# Patient Record
Sex: Female | Born: 1975 | Race: Black or African American | Hispanic: No | Marital: Single | Smoking: Never smoker
Health system: Southern US, Community
[De-identification: ages and names within clinical notes are randomized; demographics above are authoritative.]

## PROBLEM LIST (undated history)

## (undated) DIAGNOSIS — I10 Essential (primary) hypertension: Secondary | ICD-10-CM

---

## 2004-05-10 ENCOUNTER — Emergency Department: Payer: Self-pay | Admitting: Emergency Medicine

## 2004-07-26 ENCOUNTER — Emergency Department: Payer: Self-pay | Admitting: Emergency Medicine

## 2006-07-25 ENCOUNTER — Emergency Department: Payer: Self-pay | Admitting: Unknown Physician Specialty

## 2006-08-06 ENCOUNTER — Emergency Department: Payer: Self-pay | Admitting: Emergency Medicine

## 2008-01-19 ENCOUNTER — Emergency Department: Payer: Self-pay | Admitting: Emergency Medicine

## 2008-02-06 ENCOUNTER — Encounter: Payer: Self-pay | Admitting: Sports Medicine

## 2008-02-13 ENCOUNTER — Encounter: Payer: Self-pay | Admitting: Sports Medicine

## 2008-03-14 ENCOUNTER — Encounter: Payer: Self-pay | Admitting: Sports Medicine

## 2010-05-18 ENCOUNTER — Emergency Department: Payer: Self-pay | Admitting: Unknown Physician Specialty

## 2010-08-15 ENCOUNTER — Emergency Department: Payer: Self-pay | Admitting: Emergency Medicine

## 2013-04-11 ENCOUNTER — Emergency Department: Payer: Self-pay | Admitting: Internal Medicine

## 2014-10-03 ENCOUNTER — Emergency Department
Admit: 2014-10-03 | Disposition: A | Discharge status: Home / Self Care | Payer: Self-pay | Admitting: Emergency Medicine

## 2015-07-03 ENCOUNTER — Emergency Department
Admission: EM | Admit: 2015-07-03 | Discharge: 2015-07-04 | Disposition: A | Discharge status: Home / Self Care | Payer: Self-pay | Attending: Emergency Medicine | Admitting: Emergency Medicine

## 2015-07-03 DIAGNOSIS — R51 Headache: Secondary | ICD-10-CM | POA: Insufficient documentation

## 2015-07-03 DIAGNOSIS — R519 Headache, unspecified: Secondary | ICD-10-CM

## 2015-07-03 DIAGNOSIS — I10 Essential (primary) hypertension: Secondary | ICD-10-CM

## 2015-07-03 DIAGNOSIS — Z3202 Encounter for pregnancy test, result negative: Secondary | ICD-10-CM | POA: Insufficient documentation

## 2015-07-03 HISTORY — DX: Essential (primary) hypertension: I10

## 2015-07-03 LAB — CBC
HCT: 38 % (ref 35.0–47.0)
Hemoglobin: 12.3 g/dL (ref 12.0–16.0)
MCH: 27 pg (ref 26.0–34.0)
MCHC: 32.4 g/dL (ref 32.0–36.0)
MCV: 83.3 fL (ref 80.0–100.0)
Platelets: 238 10*3/uL (ref 150–440)
RBC: 4.56 MIL/uL (ref 3.80–5.20)
RDW: 13 % (ref 11.5–14.5)
WBC: 5 10*3/uL (ref 3.6–11.0)

## 2015-07-03 LAB — BASIC METABOLIC PANEL WITH GFR
Anion gap: 6 (ref 5–15)
BUN: 15 mg/dL (ref 6–20)
CO2: 28 mmol/L (ref 22–32)
Calcium: 9.1 mg/dL (ref 8.9–10.3)
Chloride: 105 mmol/L (ref 101–111)
Creatinine, Ser: 0.71 mg/dL (ref 0.44–1.00)
GFR calc Af Amer: >60 mL/min
GFR calc non Af Amer: >60 mL/min
Glucose, Bld: 94 mg/dL (ref 65–99)
Potassium: 3.5 mmol/L (ref 3.5–5.1)
Sodium: 139 mmol/L (ref 135–145)

## 2015-07-03 LAB — POCT PREGNANCY, URINE: Preg Test, Ur: NEGATIVE

## 2015-07-03 LAB — TROPONIN I: Troponin I: <0.03 ng/mL

## 2015-07-03 MED ORDER — KETOROLAC TROMETHAMINE 30 MG/ML IJ SOLN
30.0000 mg | Freq: Once | INTRAMUSCULAR | Status: AC
  Administered 2015-07-03: 30 mg via INTRAVENOUS
  Filled 2015-07-03: qty 1

## 2015-07-03 MED ORDER — SODIUM CHLORIDE 0.9 % IV BOLUS (SEPSIS)
1000.0000 mL | Freq: Once | INTRAVENOUS | Status: AC
  Administered 2015-07-03: 1000 mL via INTRAVENOUS

## 2015-07-03 MED ORDER — ONDANSETRON HCL 4 MG/2ML IJ SOLN
4.0000 mg | Freq: Once | INTRAMUSCULAR | Status: AC
  Administered 2015-07-03: 4 mg via INTRAVENOUS
  Filled 2015-07-03: qty 2

## 2015-07-03 NOTE — ED Notes (Signed)
Pt in with co headache, co chest tightness and htn 168/100.

## 2015-07-03 NOTE — ED Provider Notes (Signed)
[facility] Emergency Department Provider Note  ____________________________________________  Time seen: 11:00 PM  I have reviewed the triage vital signs and the nursing notes.   HISTORY  Chief Complaint Headache      HPI Sherry Richardson is a 40 y.o. female resents with headache and hypertension 2 days. Patient states she has a known history of hypertension but has been noncompliant with her medications for "a long time" secondary to frequent urination. Patient now presents with a frontal headache that is currently 7 out of 10 and nonradiating. Patient denies any weakness numbness no gait instability or dizziness     Past medical history Hypertension There are no active problems to display for this patient.   Past surgical history None No current outpatient prescriptions on file.  Allergies No known drug allergies No family history on file.  Social History Social History  Substance Use Topics  . Smoking status: Not on file  . Smokeless tobacco: Not on file  . Alcohol Use: Not on file    Review of Systems  Constitutional: Negative for fever. Eyes: Negative for visual changes. ENT: Negative for sore throat. Cardiovascular: Negative for chest pain. Respiratory: Negative for shortness of breath. Gastrointestinal: Negative for abdominal pain, vomiting and diarrhea. Genitourinary: Negative for dysuria. Musculoskeletal: Negative for back pain. Skin: Negative for rash. Neurological: Positive for headache   10-point ROS otherwise negative.  ____________________________________________   PHYSICAL EXAM:  VITAL SIGNS: ED Triage Vitals  Enc Vitals Group     BP 07/03/15 2030 180/97 mmHg     Pulse Rate 07/03/15 2030 84     Resp 07/03/15 2030 20     Temp 07/03/15 2030 98.2 F (36.8 C)     Temp Source 07/03/15 2030 Oral     SpO2 07/03/15 2030 100 %     Weight 07/03/15 2030 282 lb (127.914 kg)     Height 07/03/15 2030  (1.626 m)      Head Cir --      Peak Flow --      Pain Score 07/03/15 2036 7     Pain Loc --      Pain Edu? --      Excl. in GC? --     Constitutional: Alert and oriented. Well appearing and in no distress. Eyes: Conjunctivae are normal. PERRL. Normal extraocular movements. ENT   Head: Normocephalic and atraumatic.   Nose: No congestion/rhinnorhea.   Mouth/Throat: Mucous membranes are moist.   Neck: No stridor. Hematological/Lymphatic/Immunilogical: No cervical lymphadenopathy. Cardiovascular: Normal rate, regular rhythm. Normal and symmetric distal pulses are present in all extremities. No murmurs, rubs, or gallops. Respiratory: Normal respiratory effort without tachypnea nor retractions. Breath sounds are clear and equal bilaterally. No wheezes/rales/rhonchi. Gastrointestinal: Soft and nontender. No distention. There is no CVA tenderness. Genitourinary: deferred Musculoskeletal: Nontender with normal range of motion in all extremities. No joint effusions.  No lower extremity tenderness nor edema. Neurologic:  Normal speech and language. No gross focal neurologic deficits are appreciated. Speech is normal.  Skin:  Skin is warm, dry and intact. No rash noted. Psychiatric: Mood and affect are normal. Speech and behavior are normal. Patient exhibits appropriate insight and judgment.    EKG  ED ECG REPORT I, BROWN, [place] N, the attending physician, personally viewed and interpreted this ECG.   Date: 07/03/2015  EKG Time: 8:41 PM  Rate: 67  Rhythm: Normal sinus rhythm  Axis: None  Intervals: Normal  ST&T Change: None   ____________________________________________  RADIOLOGY    CT Angio Head W/Cm &/Or Wo Cm (Final result) Result time: 07/04/15 00:49:12   Final result by Rad Results In Interface (07/04/15 00:49:12)   Narrative:   CLINICAL DATA: Severe headache for 2 days, chest tightness and hypertension.  EXAM: CT ANGIOGRAPHY HEAD  TECHNIQUE: Multidetector  CT imaging of the head was performed using the standard protocol during bolus administration of intravenous contrast. Multiplanar CT image reconstructions and MIPs were obtained to evaluate the vascular anatomy.  CONTRAST: OMNIPAQUE IOHEXOL 350 MG/ML SOLN  COMPARISON: None.  FINDINGS: CT HEAD  The ventricles and sulci are normal. No intraparenchymal hemorrhage, mass effect nor midline shift. No acute large vascular territory infarcts. No abnormal intracranial enhancement.  No abnormal extra-axial fluid collections. Basal cisterns are patent.  No skull fracture. 10 mm bony excrescence from RIGHT frontal calvarium vertex, could represent a meningioma without mass effect. The included ocular globes and orbital contents are non-suspicious. The mastoid aircells and included paranasal sinuses are well-aerated. Fullness of the nasopharyngeal soft tissues can be seen with recent viral illness or, immunocompromised states.  CTA HEAD  Anterior circulation: Normal appearance of the cervical internal carotid arteries, petrous, cavernous and supra clinoid internal carotid arteries. Widely patent anterior communicating artery. Normal appearance of the anterior and middle cerebral arteries.  Posterior circulation: Codominant vertebral arteries with normal appearance of the vertebral arteries, vertebrobasilar junction and basilar artery, as well as main branch vessels. Normal appearance of the posterior cerebral arteries.  No large vessel occlusion, hemodynamically significant stenosis, dissection, luminal irregularity, contrast extravasation or aneurysm within the anterior nor posterior circulation.  IMPRESSION: 10 mm possible RIGHT frontal meningioma with otherwise negative CT head with and without contrast.  Normal CTA head.   Electronically Signed By: Awilda Metro M.D. On: 07/04/2015 00:49          INITIAL IMPRESSION / ASSESSMENT AND PLAN / ED  COURSE  Pertinent labs & imaging results that were available during my care of the patient were reviewed by me and considered in my medical decision making (see chart for details).    ____________________________________________   FINAL CLINICAL IMPRESSION(S) / ED DIAGNOSES  Final diagnoses:  Hypertension  Headache      Darci Current, MD 07/04/15 [phone]

## 2015-07-04 ENCOUNTER — Encounter: Payer: Self-pay | Admitting: Radiology

## 2015-07-04 ENCOUNTER — Emergency Department: Payer: Medicaid Other

## 2015-07-04 MED ORDER — PROCHLORPERAZINE EDISYLATE 5 MG/ML IJ SOLN
INTRAMUSCULAR | Status: AC
  Administered 2015-07-04: 10 mg via INTRAVENOUS
  Filled 2015-07-04: qty 2

## 2015-07-04 MED ORDER — LISINOPRIL 20 MG PO TABS: 20.0000 mg | ORAL_TABLET | Freq: Every day | ORAL | Status: DC

## 2015-07-04 MED ORDER — IOHEXOL 350 MG/ML SOLN
100.0000 mL | Freq: Once | INTRAVENOUS | Status: AC | PRN
Start: 2015-07-04 — End: 2015-07-04
  Administered 2015-07-04: 100 mL via INTRAVENOUS

## 2015-07-04 MED ORDER — PROCHLORPERAZINE EDISYLATE 5 MG/ML IJ SOLN
10.0000 mg | Freq: Once | INTRAMUSCULAR | Status: AC
  Administered 2015-07-04: 10 mg via INTRAVENOUS

## 2015-07-04 NOTE — Discharge Instructions (Signed)
General Headache Without Cause °A headache is pain or discomfort felt around the head or neck area. There are many causes and types of headaches. In some cases, the cause may not be found.  °HOME CARE  °Managing Pain °· Take over-the-counter and prescription medicines only as told by your doctor. °· Lie down in a dark, quiet room when you have a headache. °· If directed, apply ice to the head and neck area: °· Put ice in a plastic bag. °· Place a towel between your skin and the bag. °· Leave the ice on for 20 minutes, 2-3 times per day. °· Use a heating pad or hot shower to apply heat to the head and neck area as told by your doctor. °· Keep lights dim if bright lights bother you or make your headaches worse. °Eating and Drinking °· Eat meals on a regular schedule. °· Lessen how much alcohol you drink. °· Lessen how much caffeine you drink, or stop drinking caffeine. °General Instructions °· Keep all follow-up visits as told by your doctor. This is important. °· Keep a journal to find out if certain things bring on headaches. For example, write down: °· What you eat and drink. °· How much sleep you get. °· Any change to your diet or medicines. °· Relax by getting a massage or doing other relaxing activities. °· Lessen stress. °· Sit up straight. Do not tighten (tense) your muscles. °· Do not use tobacco products. This includes cigarettes, chewing tobacco, or e-cigarettes. If you need help quitting, ask your doctor. °· Exercise regularly as told by your doctor. °· Get enough sleep. This often means 7-9 hours of sleep. °GET HELP IF: °· Your symptoms are not helped by medicine. °· You have a headache that feels different than the other headaches. °· You feel sick to your stomach (nauseous) or you throw up (vomit). °· You have a fever. °GET HELP RIGHT AWAY IF:  °· Your headache becomes really bad. °· You keep throwing up. °· You have a stiff neck. °· You have trouble seeing. °· You have trouble speaking. °· You have  pain in the eye or ear. °· Your muscles are weak or you lose muscle control. °· You lose your balance or have trouble walking. °· You feel like you will pass out (faint) or you pass out. °· You have confusion. °  °This information is not intended to replace advice given to you by your health care provider. Make sure you discuss any questions you have with your health care provider. °  °Document Released: 03/09/2008 Document Revised: 02/19/2015 Document Reviewed: 09/23/2014 °Elsevier Interactive Patient Education ©2016 [employer] ° °Hypertension °Hypertension, commonly called high blood pressure, is when the force of blood pumping through your arteries is too strong. Your arteries are the blood vessels that carry blood from your heart throughout your body. A blood pressure reading consists of a higher number over a lower number, such as 110/72. The higher number (systolic) is the pressure inside your arteries when your heart pumps. The lower number (diastolic) is the pressure inside your arteries when your heart relaxes. Ideally you want your blood pressure below 120/80. °Hypertension forces your heart to work harder to pump blood. Your arteries may become narrow or stiff. Having untreated or uncontrolled hypertension can cause heart attack, stroke, kidney disease, and other problems. °RISK FACTORS °Some risk factors for high blood pressure are controllable. Others are not.  °Risk factors you cannot control include:  °· Race. You may   be at higher risk if you are African American. °· Age. Risk increases with age. °· Gender. Men are at higher risk than women before age 45 years. After age 65, women are at higher risk than men. °Risk factors you can control include: °· Not getting enough exercise or physical activity. °· Being overweight. °· Getting too much fat, sugar, calories, or salt in your diet. °· Drinking too much alcohol. °SIGNS AND SYMPTOMS °Hypertension does not usually cause signs or symptoms. Extremely  high blood pressure (hypertensive crisis) may cause headache, anxiety, shortness of breath, and nosebleed. °DIAGNOSIS °To check if you have hypertension, your health care provider will measure your blood pressure while you are seated, with your arm held at the level of your heart. It should be measured at least twice using the same arm. Certain conditions can cause a difference in blood pressure between your right and left arms. A blood pressure reading that is higher than normal on one occasion does not mean that you need treatment. If it is not clear whether you have high blood pressure, you may be asked to return on a different day to have your blood pressure checked again. Or, you may be asked to monitor your blood pressure at home for 1 or more weeks. °TREATMENT °Treating high blood pressure includes making lifestyle changes and possibly taking medicine. Living a healthy lifestyle can help lower high blood pressure. You may need to change some of your habits. °Lifestyle changes may include: °· Following the DASH diet. This diet is high in fruits, vegetables, and whole grains. It is low in salt, red meat, and added sugars. °· Keep your sodium intake below 2,300 mg per day. °· Getting at least 30-45 minutes of aerobic exercise at least 4 times per week. °· Losing weight if necessary. °· Not smoking. °· Limiting alcoholic beverages. °· Learning ways to reduce stress. °Your health care provider may prescribe medicine if lifestyle changes are not enough to get your blood pressure under control, and if one of the following is true: °· You are 18-59 years of age and your systolic blood pressure is above 140. °· You are 60 years of age or older, and your systolic blood pressure is above 150. °· Your diastolic blood pressure is above 90. °· You have diabetes, and your systolic blood pressure is over 140 or your diastolic blood pressure is over 90. °· You have kidney disease and your blood pressure is above  140/90. °· You have heart disease and your blood pressure is above 140/90. °Your personal target blood pressure may vary depending on your medical conditions, your age, and other factors. °HOME CARE INSTRUCTIONS °· Have your blood pressure rechecked as directed by your health care provider.   °· Take medicines only as directed by your health care provider. Follow the directions carefully. Blood pressure medicines must be taken as prescribed. The medicine does not work as well when you skip doses. Skipping doses also puts you at risk for problems. °· Do not smoke.   °· Monitor your blood pressure at home as directed by your health care provider.  °SEEK MEDICAL CARE IF:  °· You think you are having a reaction to medicines taken. °· You have recurrent headaches or feel dizzy. °· You have swelling in your ankles. °· You have trouble with your vision. °SEEK IMMEDIATE MEDICAL CARE IF: °· You develop a severe headache or confusion. °· You have unusual weakness, numbness, or feel faint. °· You have severe chest or abdominal pain. °·   You vomit repeatedly. °· You have trouble breathing. °MAKE SURE YOU:  °· Understand these instructions. °· Will watch your condition. °· Will get help right away if you are not doing well or get worse. °  °This information is not intended to replace advice given to you by your health care provider. Make sure you discuss any questions you have with your health care provider. °  °Document Released: 05/31/2005 Document Revised: 10/15/2014 Document Reviewed: 03/23/2013 °Elsevier Interactive Patient Education ©2016 Elsevier Inc. ° °

## 2016-03-18 ENCOUNTER — Encounter: Payer: Self-pay | Admitting: Emergency Medicine

## 2016-03-18 ENCOUNTER — Emergency Department
Admission: EM | Admit: 2016-03-18 | Discharge: 2016-03-18 | Disposition: A | Discharge status: Home / Self Care | Payer: Medicaid Other | Attending: Emergency Medicine | Admitting: Emergency Medicine

## 2016-03-18 DIAGNOSIS — H1031 Unspecified acute conjunctivitis, right eye: Secondary | ICD-10-CM

## 2016-03-18 DIAGNOSIS — I10 Essential (primary) hypertension: Secondary | ICD-10-CM | POA: Insufficient documentation

## 2016-03-18 DIAGNOSIS — Z79899 Other long term (current) drug therapy: Secondary | ICD-10-CM | POA: Insufficient documentation

## 2016-03-18 MED ORDER — CIPROFLOXACIN HCL 0.3 % OP SOLN
2.0000 [drp] | OPHTHALMIC | Status: DC
  Administered 2016-03-18: 2 [drp] via OPHTHALMIC
  Filled 2016-03-18: qty 2.5

## 2016-03-18 MED ORDER — CIPROFLOXACIN HCL 0.3 % OP SOLN: 1.0000 [drp] | OPHTHALMIC | 0 refills | Status: AC

## 2016-03-18 MED ORDER — FLUORESCEIN SODIUM 1 MG OP STRP
1.0000 | ORAL_STRIP | Freq: Once | OPHTHALMIC | Status: AC
  Administered 2016-03-18: 1 via OPHTHALMIC

## 2016-03-18 MED ORDER — TETRACAINE HCL 0.5 % OP SOLN
2.0000 [drp] | Freq: Once | OPHTHALMIC | Status: AC
  Administered 2016-03-18: 2 [drp] via OPHTHALMIC

## 2016-03-18 MED ORDER — FLUORESCEIN SODIUM 1 MG OP STRP
ORAL_STRIP | OPHTHALMIC | Status: AC
  Administered 2016-03-18: 1 via OPHTHALMIC
  Filled 2016-03-18: qty 1

## 2016-03-18 MED ORDER — TETRACAINE HCL 0.5 % OP SOLN
OPHTHALMIC | Status: AC
Start: 2016-03-18 — End: 2016-03-18
  Administered 2016-03-18: 2 [drp] via OPHTHALMIC
  Filled 2016-03-18: qty 2

## 2016-03-18 NOTE — ED Triage Notes (Signed)
Pt arrived to the ED for complaints of right eye pain. Pt states that she woke up with the swelling denies any injury. Pt's right eye is swollen and has a mild redness. Pt is AOx4 in no apparent distress.

## 2016-03-18 NOTE — ED Provider Notes (Signed)
[facility] Emergency Department Provider Note ____________________________________________  Time seen: Approximately 9:27 PM  I have reviewed the triage vital signs and the nursing notes.   HISTORY  Chief Complaint Eye Pain   HPI Sherry Richardson is a 40 y.o. female who presents to the emergency department for evaluation of right eye pain. She awakened with swelling to the right upper lid with some drainage and itching with some purulent drainage. She has had no known exposure to conjunctivitis but denies injury. She does not wear contact lenses. She is hypertensive today, but endorses a history of hypertension and is not taking any medication. She is asymptomatic with the exception of right eye pain.  Past Medical History:  Diagnosis Date  . Hypertension     There are no active problems to display for this patient.   History reviewed. No pertinent surgical history.  Prior to Admission medications   Medication Sig Start Date End Date Taking? Authorizing Provider  ciprofloxacin (CILOXAN) 0.3 % ophthalmic solution Place 1 drop into the right eye every 2 (two) hours. Administer 1 drop, every 2 hours, while awake, for 2 days. Then 1 drop, every 4 hours, while awake, for the next 5 days. 03/18/16 03/23/16  Chinita Pester, FNP  lisinopril (PRINIVIL,ZESTRIL) 20 MG tablet Take 1 tablet (20 mg total) by mouth daily. 07/04/15 07/03/16  Darci Current, MD    Allergies Review of patient's allergies indicates no known allergies.  History reviewed. No pertinent family history.  Social History Social History  Substance Use Topics  . Smoking status: Never Smoker  . Smokeless tobacco: Never Used  . Alcohol use Yes    Review of Systems   Constitutional: No fever/chills Eyes: Negative for visual changes. Positive for pain. Musculoskeletal: Negative for pain. Skin: Negative for rash. Neurological: Negative for headaches, focal weakness or numbness. Allergic:  Negative for seasonal allergies. ____________________________________________  PHYSICAL EXAM:  VITAL SIGNS: ED Triage Vitals  Enc Vitals Group     BP 03/18/16 2040 (!) 166/101     Pulse Rate 03/18/16 2040 80     Resp 03/18/16 2040 18     Temp 03/18/16 2040 98.5 F (36.9 C)     Temp Source 03/18/16 2040 Oral     SpO2 03/18/16 2040 97 %     Weight 03/18/16 2042 288 lb (130.6 kg)     Height 03/18/16 2042 5\' 6"  (1.676 m)     Head Circumference --      Peak Flow --      Pain Score 03/18/16 2042 8     Pain Loc --      Pain Edu? --      Excl. in GC? --     Constitutional: Alert and oriented. Well appearing and in no acute distress. Eyes: Visual acuity--see nursing documentation; no globe trauma; right upper eyelid mildly edematous; Sclera appears anicteric.  Eyelids were inverted. Conjunctiva appears erythematous with purulent exudate in the inner canthus; Cornea--no abrasion on stain exam. Head: Atraumatic. Nose: No congestion/rhinnorhea. Mouth/Throat: Mucous membranes are moist. Respiratory: Rate even and unalbored. Musculoskeletal:Normal ROM x 4 extremities. Neurologic:  Normal speech and language. No gross focal neurologic deficits are appreciated. Speech is normal. No gait instability. Skin:  Skin is warm, dry and intact. No rash noted. Psychiatric: Mood and affect are normal. Speech and behavior are normal.  ____________________________________________   LABS (all labs ordered are listed, but only abnormal results are displayed)  Labs Reviewed - No data to display ____________________________________________  EKG   ____________________________________________  RADIOLOGY  Not inidcated. ____________________________________________   PROCEDURES  Procedure(s) performed:   Fluorescein stain exam to the right eye. IOP exam via Tono-Pen: 17, 18, 17 ____________________________________________   INITIAL IMPRESSION / ASSESSMENT AND PLAN / ED COURSE  Pertinent  labs & imaging results that were available during my care of the patient were reviewed by me and considered in my medical decision making (see chart for details).  Clinical Course    Patient to receive prescriptions for Ciprofloxacin drops.  She was advised to follow up with opthalmology for symptoms that are not improving over the next 2-3 days.She was also advised to see her primary care provider for management of her hypertension. She was advised to return to the ER for symptoms that change or worsen if unable to schedule an appointment.  ____________________________________________   FINAL CLINICAL IMPRESSION(S) / ED DIAGNOSES  Final diagnoses:  Hypertension, unspecified type  Acute conjunctivitis of right eye, unspecified acute conjunctivitis type    Note:  This document was prepared using Dragon voice recognition software and may include unintentional dictation errors.    Chinita PesterCari B Per Beagley, FNP 03/20/16 1021    Nita Sicklearolina Veronese, MD 03/21/16 2224

## 2016-10-20 ENCOUNTER — Emergency Department: Payer: Medicaid Other

## 2016-10-20 ENCOUNTER — Encounter: Payer: Self-pay | Admitting: Emergency Medicine

## 2016-10-20 ENCOUNTER — Emergency Department
Admission: EM | Admit: 2016-10-20 | Discharge: 2016-10-20 | Disposition: A | Discharge status: Home / Self Care | Payer: Medicaid Other | Attending: Emergency Medicine | Admitting: Emergency Medicine

## 2016-10-20 DIAGNOSIS — I10 Essential (primary) hypertension: Secondary | ICD-10-CM | POA: Diagnosis not present

## 2016-10-20 DIAGNOSIS — R42 Dizziness and giddiness: Secondary | ICD-10-CM | POA: Diagnosis not present

## 2016-10-20 LAB — BASIC METABOLIC PANEL
ANION GAP: 7 (ref 5–15)
BUN: 11 mg/dL (ref 6–20)
CALCIUM: 9.1 mg/dL (ref 8.9–10.3)
CO2: 28 mmol/L (ref 22–32)
Chloride: 103 mmol/L (ref 101–111)
Creatinine, Ser: 0.73 mg/dL (ref 0.44–1.00)
Glucose, Bld: 107 mg/dL — ABNORMAL HIGH (ref 65–99)
Potassium: 3.1 mmol/L — ABNORMAL LOW (ref 3.5–5.1)
Sodium: 138 mmol/L (ref 135–145)

## 2016-10-20 LAB — CBC
HCT: 37.9 % (ref 35.0–47.0)
HEMOGLOBIN: 12.6 g/dL (ref 12.0–16.0)
MCH: 28 pg (ref 26.0–34.0)
MCHC: 33.3 g/dL (ref 32.0–36.0)
MCV: 84.2 fL (ref 80.0–100.0)
Platelets: 251 10*3/uL (ref 150–440)
RBC: 4.5 MIL/uL (ref 3.80–5.20)
RDW: 13.9 % (ref 11.5–14.5)
WBC: 6.2 10*3/uL (ref 3.6–11.0)

## 2016-10-20 LAB — TROPONIN I

## 2016-10-20 MED ORDER — SODIUM CHLORIDE 0.9 % IV SOLN
Freq: Once | INTRAVENOUS | Status: AC
  Administered 2016-10-20: 12:00:00 via INTRAVENOUS

## 2016-10-20 MED ORDER — LISINOPRIL 10 MG PO TABS
20.0000 mg | ORAL_TABLET | Freq: Once | ORAL | Status: AC
  Administered 2016-10-20: 20 mg via ORAL
  Filled 2016-10-20: qty 2

## 2016-10-20 MED ORDER — METOCLOPRAMIDE HCL 5 MG/ML IJ SOLN
10.0000 mg | Freq: Once | INTRAMUSCULAR | Status: AC
  Administered 2016-10-20: 10 mg via INTRAVENOUS
  Filled 2016-10-20: qty 2

## 2016-10-20 MED ORDER — KETOROLAC TROMETHAMINE 30 MG/ML IJ SOLN
30.0000 mg | Freq: Once | INTRAMUSCULAR | Status: AC
  Administered 2016-10-20: 30 mg via INTRAVENOUS
  Filled 2016-10-20: qty 1

## 2016-10-20 MED ORDER — ONDANSETRON HCL 4 MG/2ML IJ SOLN
4.0000 mg | Freq: Once | INTRAMUSCULAR | Status: AC
  Administered 2016-10-20: 4 mg via INTRAVENOUS
  Filled 2016-10-20: qty 2

## 2016-10-20 MED ORDER — DIAZEPAM 5 MG PO TABS
5.0000 mg | ORAL_TABLET | Freq: Once | ORAL | Status: AC
  Administered 2016-10-20: 5 mg via ORAL
  Filled 2016-10-20: qty 1

## 2016-10-20 MED ORDER — LISINOPRIL 20 MG PO TABS: 20.0000 mg | ORAL_TABLET | Freq: Every day | ORAL | 0 refills | Status: AC

## 2016-10-20 NOTE — ED Triage Notes (Signed)
Pt presents to ED via [facility]. Per EMS pt was picked up from work with c/o midline chest pressure 7/10 with no radiation. EMS states patient also c/o dizziness, both chest pain and dizziness started at approz 0400. Pt denies any N/V/SHOB, denies any numbness/tingling/vision changes. Pt is alert and oriented and ambulatory on arrival.

## 2016-10-20 NOTE — ED Provider Notes (Addendum)
[facility] Emergency Department Provider Note       Time seen: ----------------------------------------- 11:24 AM on 10/20/2016 -----------------------------------------     I have reviewed the triage vital signs and the nursing notes.   HISTORY   Chief Complaint No chief complaint on file.    HPI Sherry Richardson is a 41 y.o. female who presents to the ED for dizziness chest pain. Patient states she was at work when she felt like she may pass out. She reports she has not had her blood pressure medicine and some time. She denies any recent illness, does not take any over-the-counter medications. Symptom onset was this morning.   Past Medical History:  Diagnosis Date  . Hypertension     There are no active problems to display for this patient.   No past surgical history on file.  Allergies Patient has no known allergies.  Social History Social History  Substance Use Topics  . Smoking status: Never Smoker  . Smokeless tobacco: Never Used  . Alcohol use Yes    Review of Systems Constitutional: Negative for fever. Eyes: Negative for vision changes ENT:  Negative for congestion, sore throat Cardiovascular: Positive for chest pain Respiratory: Negative for shortness of breath. Gastrointestinal: Negative for abdominal pain, vomiting and diarrhea. Genitourinary: Negative for dysuria. Musculoskeletal: Negative for back pain. Skin: Negative for rash. Neurological: Negative for headaches, focal weakness or numbness. Positive for dizziness  All systems negative/normal/unremarkable except as stated in the HPI  ____________________________________________   PHYSICAL EXAM:  VITAL SIGNS: ED Triage Vitals  Enc Vitals Group     BP      Pulse      Resp      Temp      Temp src      SpO2      Weight      Height      Head Circumference      Peak Flow      Pain Score      Pain Loc      Pain Edu?      Excl. in GC?     Constitutional:  Alert and oriented. Anxious, no distress Eyes: Conjunctivae are normal. PERRL. Normal extraocular movements. ENT   Head: Normocephalic and atraumatic.   Nose: No congestion/rhinnorhea.   Mouth/Throat: Mucous membranes are moist.   Neck: No stridor. Cardiovascular: Normal rate, regular rhythm. No murmurs, rubs, or gallops. Respiratory: Normal respiratory effort without tachypnea nor retractions. Breath sounds are clear and equal bilaterally. No wheezes/rales/rhonchi. Gastrointestinal: Soft and nontender. Normal bowel sounds Musculoskeletal: Nontender with normal range of motion in extremities. No lower extremity tenderness nor edema. Neurologic:  Normal speech and language. No gross focal neurologic deficits are appreciated.  Skin:  Skin is warm, dry and intact. No rash noted. Psychiatric: Mood and affect are normal. Speech and behavior are normal.  ____________________________________________  EKG: Interpreted by me. Sinus rhythm rate 95 bpm, normal PR interval, normal QRS, long QT.  ____________________________________________  ED COURSE:  Pertinent labs & imaging results that were available during my care of the patient were reviewed by me and considered in my medical decision making (see chart for details). Patient presents for dizziness and chest pain, we will assess with labs and imaging as indicated.   Procedures ____________________________________________   LABS (pertinent positives/negatives)  Labs Reviewed  BASIC METABOLIC PANEL - Abnormal; Notable for the following:       Result Value   Potassium 3.1 (*)    Glucose, Bld  107 (*)    All other components within normal limits  CBC  TROPONIN I    RADIOLOGY  Chest x-ray IMPRESSION: No acute intracranial abnormalities. CT head  Dural calcification at RIGHT vertex, 10 mm diameter question meningioma unchanged. IMPRESSION: No active disease. ____________________________________________  FINAL  ASSESSMENT AND PLAN  Chest pain, hypertension, headache, dizziness  Plan: Patient's labs and imaging were dictated above. Patient had presented for multiple complaints including chest pain, high blood pressure, dizziness and headache. Patient is concerned she may have migraines. She describes typically right-sided headaches that she takes Excedrin Migraine for. She appears neurologically intact, labs and workup here has been reassuring. I have restarted her high blood pressure medication and we'll prescribe headache medicine for her to take at home. She is stable for outpatient follow-up with her doctor.   Emily FilbertWilliams, Achilles Neville E, MD   Note: This note was generated in part or whole with voice recognition software. Voice recognition is usually quite accurate but there are transcription errors that can and very often do occur. I apologize for any typographical errors that were not detected and corrected.     Emily FilbertWilliams, Caine Barfield E, MD 10/20/16 1414    Emily FilbertWilliams, Gordana Kewley E, MD 10/20/16 [phone]

## 2016-10-20 NOTE — ED Notes (Signed)
Explained and apologized for delay. Pt states understanding. Pt noted to be resting in bed with eyes closed prior to this RN waking her up. Will continue to monitor for further patient needs

## 2016-10-20 NOTE — ED Notes (Signed)
NAD noted at time of D/C. Pt taken to lobby via wheelchair by her family. Denies any comments/concerns at this time.

## 2018-02-27 ENCOUNTER — Emergency Department
Admission: EM | Admit: 2018-02-27 | Discharge: 2018-02-27 | Disposition: A | Discharge status: Home / Self Care | Payer: Medicaid Other | Attending: Emergency Medicine | Admitting: Emergency Medicine

## 2018-02-27 ENCOUNTER — Emergency Department: Payer: Medicaid Other

## 2018-02-27 ENCOUNTER — Encounter: Payer: Self-pay | Admitting: Emergency Medicine

## 2018-02-27 ENCOUNTER — Other Ambulatory Visit: Payer: Self-pay

## 2018-02-27 DIAGNOSIS — R519 Headache, unspecified: Secondary | ICD-10-CM

## 2018-02-27 DIAGNOSIS — I10 Essential (primary) hypertension: Secondary | ICD-10-CM | POA: Insufficient documentation

## 2018-02-27 DIAGNOSIS — R51 Headache: Secondary | ICD-10-CM | POA: Insufficient documentation

## 2018-02-27 LAB — COMPREHENSIVE METABOLIC PANEL
ALT: 12 U/L (ref 0–44)
AST: 14 U/L — AB (ref 15–41)
Albumin: 3.7 g/dL (ref 3.5–5.0)
Alkaline Phosphatase: 74 U/L (ref 38–126)
Anion gap: 6 (ref 5–15)
BUN: 12 mg/dL (ref 6–20)
CHLORIDE: 105 mmol/L (ref 98–111)
CO2: 28 mmol/L (ref 22–32)
CREATININE: 0.57 mg/dL (ref 0.44–1.00)
Calcium: 9 mg/dL (ref 8.9–10.3)
GFR calc Af Amer: >60 mL/min (ref >60)
GFR calc non Af Amer: >60 mL/min (ref >60)
Glucose, Bld: 110 mg/dL — ABNORMAL HIGH (ref 70–99)
Potassium: 3.6 mmol/L (ref 3.5–5.1)
SODIUM: 139 mmol/L (ref 135–145)
Total Bilirubin: 0.7 mg/dL (ref 0.3–1.2)
Total Protein: 7.8 g/dL (ref 6.5–8.1)

## 2018-02-27 LAB — CBC
HCT: 36.9 % (ref 35.0–47.0)
HEMOGLOBIN: 12.3 g/dL (ref 12.0–16.0)
MCH: 28.7 pg (ref 26.0–34.0)
MCHC: 33.4 g/dL (ref 32.0–36.0)
MCV: 85.8 fL (ref 80.0–100.0)
PLATELETS: 229 10*3/uL (ref 150–440)
RBC: 4.3 MIL/uL (ref 3.80–5.20)
RDW: 14 % (ref 11.5–14.5)
WBC: 5.5 10*3/uL (ref 3.6–11.0)

## 2018-02-27 LAB — TROPONIN I

## 2018-02-27 MED ORDER — SODIUM CHLORIDE 0.9 % IV BOLUS
1000.0000 mL | Freq: Once | INTRAVENOUS | Status: AC
  Administered 2018-02-27: 1000 mL via INTRAVENOUS

## 2018-02-27 MED ORDER — AMLODIPINE BESYLATE 5 MG PO TABS: 5.0000 mg | ORAL_TABLET | Freq: Every day | ORAL | 1 refills | Status: DC

## 2018-02-27 MED ORDER — PROCHLORPERAZINE EDISYLATE 10 MG/2ML IJ SOLN
10.0000 mg | Freq: Once | INTRAMUSCULAR | Status: AC
  Administered 2018-02-27: 10 mg via INTRAVENOUS
  Filled 2018-02-27: qty 2

## 2018-02-27 NOTE — ED Provider Notes (Signed)
[facility] Emergency Department Provider Note  ____________________________________________   I have reviewed the triage vital signs and the nursing notes.   HISTORY  Chief Complaint Headache   History limited by: Not Limited   HPI Sherry Richardson is a 42 y.o. female who presents to the emergency department today because of concerns for headache.  Patient states is been going on for 2 days.  Located primarily in the front of her head and behind her right eye.  This is typically where she gets her headaches.  She is tried Tylenol and ibuprofen at home without any relief.  She did eyes any significant nausea or vomiting.  She has not noticed any photophobia.  She did have some complaint of some pressure on the right side of her chest as well.   Per medical record review patient has a history of hypertension  Past Medical History:  Diagnosis Date  . Hypertension     There are no active problems to display for this patient.   History reviewed. No pertinent surgical history.  Prior to Admission medications   Medication Sig Start Date End Date Taking? Authorizing Provider  lisinopril (PRINIVIL,ZESTRIL) 20 MG tablet Take 1 tablet (20 mg total) by mouth daily. 10/20/16 10/20/17  Emily FilbertWilliams, Jonathan E, MD    Allergies Patient has no known allergies.  No family history on file.  Social History Social History   Tobacco Use  . Smoking status: Never Smoker  . Smokeless tobacco: Never Used  Substance Use Topics  . Alcohol use: Yes  . Drug use: No    Review of Systems Constitutional: No fever/chills Eyes: No visual changes. ENT: No sore throat. Cardiovascular: Positive for right-sided chest pain Respiratory: Denies shortness of breath. Gastrointestinal: No abdominal pain.  No nausea, no vomiting.  No diarrhea.   Genitourinary: Negative for dysuria. Musculoskeletal: Negative for back pain. Skin: Negative for rash. Neurological: Positive for  headache  ____________________________________________   PHYSICAL EXAM:  VITAL SIGNS: ED Triage Vitals  Enc Vitals Group     BP 02/27/18 1242 (!) 162/100     Pulse Rate 02/27/18 1242 84     Resp 02/27/18 1242 18     Temp 02/27/18 1242 98.6 F (37 C)     Temp Source 02/27/18 1242 Oral     SpO2 02/27/18 1242 96 %     Weight 02/27/18 1244 280 lb (127 kg)     Height 02/27/18 1244 5\' 4"  (1.626 m)     Head Circumference --      Peak Flow --      Pain Score 02/27/18 1244 9   Constitutional: Alert and oriented.  Eyes: Conjunctivae are normal.  ENT      Head: Normocephalic and atraumatic.      Nose: No congestion/rhinnorhea.      Mouth/Throat: Mucous membranes are moist.      Neck: No stridor. Hematological/Lymphatic/Immunilogical: No cervical lymphadenopathy. Cardiovascular: Normal rate, regular rhythm.  No murmurs, rubs, or gallops.  Respiratory: Normal respiratory effort without tachypnea nor retractions. Breath sounds are clear and equal bilaterally. No wheezes/rales/rhonchi. Gastrointestinal: Soft and non tender. No rebound. No guarding.  Genitourinary: Deferred Musculoskeletal: Normal range of motion in all extremities. No lower extremity edema. Neurologic:  Normal speech and language. No gross focal neurologic deficits are appreciated.  Skin:  Skin is warm, dry and intact. No rash noted. Psychiatric: Mood and affect are normal. Speech and behavior are normal. Patient exhibits appropriate insight and judgment.  ____________________________________________  LABS (pertinent positives/negatives)  Trop <0.03 CBC wbc 5.5, hgb 12.3, plt 229 CMP wnl except glu 110, ast 14  ____________________________________________   EKG  I, Phineas Semen, attending physician, personally viewed and interpreted this EKG  EKG Time: 1310 Rate: 74 Rhythm: normal sinus rhythm Axis: normal Intervals: qtc 432 QRS: narrow ST changes: no st elevation Impression: normal  ekg  ____________________________________________    RADIOLOGY  CXR No acute disease  ____________________________________________   PROCEDURES  Procedures  ____________________________________________   INITIAL IMPRESSION / ASSESSMENT AND PLAN / ED COURSE  Pertinent labs & imaging results that were available during my care of the patient were reviewed by me and considered in my medical decision making (see chart for details).   Presented to the emergency department today because of concerns for headache.  The patient did feel better after IV fluids and medications.  At this point do not feel emergent imaging is necessary.  Patient does get headaches.  Patient requested medication for blood pressure.  Will start patient on low-dose amlodipine. Discussed importance of follow up with PCP.  ____________________________________________   FINAL CLINICAL IMPRESSION(S) / ED DIAGNOSES  Final diagnoses:  Bad headache  Hypertension, unspecified type     Note: This dictation was prepared with Dragon dictation. Any transcriptional errors that result from this process are unintentional     Phineas Semen, MD 02/27/18 1921

## 2018-02-27 NOTE — ED Triage Notes (Addendum)
Headache x 3 days. Denies migraines. Denies sinus drainage. Denies head injury or use of blood thinners. Patient states she has hypertension but has been off meds x 1 year.

## 2018-02-27 NOTE — Discharge Instructions (Addendum)
Please seek medical attention for any high fevers, chest pain, shortness of breath, change in behavior, persistent vomiting, bloody stool or any other new or concerning symptoms.  

## 2018-06-27 ENCOUNTER — Other Ambulatory Visit: Payer: Self-pay | Admitting: Physician Assistant

## 2018-06-27 DIAGNOSIS — Z1231 Encounter for screening mammogram for malignant neoplasm of breast: Secondary | ICD-10-CM

## 2018-08-08 ENCOUNTER — Emergency Department
Admission: EM | Admit: 2018-08-08 | Discharge: 2018-08-08 | Disposition: A | Discharge status: Home / Self Care | Payer: Medicaid Other | Attending: Emergency Medicine | Admitting: Emergency Medicine

## 2018-08-08 ENCOUNTER — Other Ambulatory Visit: Payer: Self-pay

## 2018-08-08 ENCOUNTER — Encounter: Payer: Self-pay | Admitting: Emergency Medicine

## 2018-08-08 DIAGNOSIS — Z79899 Other long term (current) drug therapy: Secondary | ICD-10-CM | POA: Insufficient documentation

## 2018-08-08 DIAGNOSIS — J069 Acute upper respiratory infection, unspecified: Secondary | ICD-10-CM | POA: Insufficient documentation

## 2018-08-08 DIAGNOSIS — B9789 Other viral agents as the cause of diseases classified elsewhere: Secondary | ICD-10-CM

## 2018-08-08 DIAGNOSIS — I10 Essential (primary) hypertension: Secondary | ICD-10-CM | POA: Insufficient documentation

## 2018-08-08 MED ORDER — PSEUDOEPH-BROMPHEN-DM 30-2-10 MG/5ML PO SYRP: 10.0000 mL | ORAL_SOLUTION | Freq: Four times a day (QID) | ORAL | 0 refills | Status: AC | PRN

## 2018-08-08 MED ORDER — BENZONATATE 100 MG PO CAPS
200.0000 mg | ORAL_CAPSULE | Freq: Once | ORAL | Status: AC
  Administered 2018-08-08: 200 mg via ORAL
  Filled 2018-08-08: qty 2

## 2018-08-08 NOTE — ED Provider Notes (Signed)
[facility] Emergency Department Provider Note  ____________________________________________  Time seen: Approximately 9:10 PM  I have reviewed the triage vital signs and the nursing notes.   HISTORY  Chief Complaint Cough   HPI Sherry Richardson is a 43 y.o. female who presents to the emergency department for treatment and evaluation of cough for 2 days. No fever or chills. She did not get a flu shot this year. 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   Past Medical History:  Diagnosis Date  . Hypertension     There are no active problems to display for this patient.   Past Surgical History:  Procedure Laterality Date  . CESAREAN SECTION      Prior to Admission medications   Medication Sig Start Date End Date Taking? Authorizing Provider  amLODipine (NORVASC) 5 MG tablet Take 1 tablet (5 mg total) by mouth daily. 02/27/18 02/27/19  Phineas Semen, MD  brompheniramine-pseudoephedrine-DM 30-2-10 MG/5ML syrup Take 10 mLs by mouth 4 (four) times daily as needed. 08/08/18   Adlean Hardeman B, FNP  lisinopril (PRINIVIL,ZESTRIL) 20 MG tablet Take 1 tablet (20 mg total) by mouth daily. 10/20/16 10/20/17  Emily Filbert, MD    Allergies Patient has no known allergies.  No family history on file.  Social History Social History   Tobacco Use  . Smoking status: Never Smoker  . Smokeless tobacco: Never Used  Substance Use Topics  . Alcohol use: Yes  . Drug use: No    Review of Systems Constitutional: Negative for fever/chills. Normal appetite. ENT: No sore throat. Cardiovascular: Denies chest pain. Respiratory: Occasional shortness of breath. Positive for cough. Negative for wheezing.  Gastrointestinal: Negative nausea,  no vomiting.  No diarrhea.  Musculoskeletal: Negative for body aches Skin: Negative for rash. Neurological:  Negative for headaches ____________________________________________   PHYSICAL EXAM:  VITAL SIGNS: ED Triage Vitals [08/08/18 2007]  Enc Vitals Group     BP (!) 146/90     Pulse Rate 93     Resp 18     Temp 98.8 F (37.1 C)     Temp Source Oral     SpO2 100 %     Weight 280 lb (127 kg)     Height 5\' 5"  (1.651 m)     Head Circumference      Peak Flow      Pain Score 0     Pain Loc      Pain Edu?      Excl. in GC?     Constitutional: Alert and oriented. We;; appearing and in no acute distress. Eyes: Conjunctivae are normal. Ears: Bilateral TM with air fluid levels bilaterally. Nose: Maxillary sinus congestion noted; no rhinnorhea. Mouth/Throat: Mucous membranes are moist.  Oropharynx mildly erythematous. Tonsils not visualized. Uvula midline. Neck: No stridor.  Lymphatic: No cervical lymphadenopathy. Cardiovascular: Normal rate, regular rhythm. Good peripheral circulation. Respiratory: Respirations are even and unlabored.  No retractions. Breath sounds clear to auscultation.. Gastrointestinal: Soft and nontender.  Musculoskeletal: FROM x 4 extremities.  Neurologic:  Normal speech and language. Skin:  Skin is warm, dry and intact. No rash noted. Psychiatric: Mood and affect are normal. Speech and behavior are normal.  ____________________________________________   LABS (all labs ordered are listed, but only abnormal results are displayed)  Labs Reviewed - No data to display ____________________________________________  EKG  Not indicated.  ____________________________________________  RADIOLOGY  Not indicated. ____________________________________________   PROCEDURES  Procedure(s) performed: None  Critical Care performed: No ____________________________________________   INITIAL IMPRESSION / ASSESSMENT AND PLAN / ED COURSE  43 y.o. female who presents to the emergency department for treatment and evaluation of URI symptoms that have been present for  the past 2 days.  Exam is reassuring.  She will be treated with Bromfed for cough.  She was encouraged to take Tylenol or ibuprofen if needed for body aches, fever, or headache.  She was encouraged to wear a mask when she is around her grandmother if she has to take care of her.  She was advised to follow-up with the primary care provider for choice for symptoms that are not improving over the next several days.  She was encouraged to return to the emergency department for symptoms of change or worsen if unable to schedule an appointment.    Medications  benzonatate (TESSALON) capsule 200 mg (200 mg Oral Given 08/08/18 2136)    ED Discharge Orders         Ordered    brompheniramine-pseudoephedrine-DM 30-2-10 MG/5ML syrup  4 times daily PRN     08/08/18 2132           Pertinent labs & imaging results that were available during my care of the patient were reviewed by me and considered in my medical decision making (see chart for details).    If controlled substance prescribed during this visit, 12 month history viewed on the [facility] prior to issuing an initial prescription for Schedule II or III opiod. ____________________________________________   FINAL CLINICAL IMPRESSION(S) / ED DIAGNOSES  Final diagnoses:  Viral URI with cough    Note:  This document was prepared using Dragon voice recognition software and may include unintentional dictation errors.     Chinita Pester, FNP 08/09/18 9169    Jeanmarie Plant, MD 08/11/18 [phone]

## 2018-08-08 NOTE — ED Triage Notes (Signed)
Patient ambulatory to triage with steady gait, without difficulty or distress noted, mask in place; pt reports cough x mo; denies fever or sinus drainage

## 2020-01-25 ENCOUNTER — Emergency Department
Admission: EM | Admit: 2020-01-25 | Discharge: 2020-01-25 | Disposition: A | Discharge status: Home / Self Care | Payer: Self-pay | Attending: Student in an Organized Health Care Education/Training Program | Admitting: Student in an Organized Health Care Education/Training Program

## 2020-01-25 ENCOUNTER — Emergency Department: Payer: Self-pay

## 2020-01-25 ENCOUNTER — Other Ambulatory Visit: Payer: Self-pay

## 2020-01-25 ENCOUNTER — Encounter: Payer: Self-pay | Admitting: Emergency Medicine

## 2020-01-25 DIAGNOSIS — Z79899 Other long term (current) drug therapy: Secondary | ICD-10-CM | POA: Insufficient documentation

## 2020-01-25 DIAGNOSIS — I10 Essential (primary) hypertension: Secondary | ICD-10-CM | POA: Insufficient documentation

## 2020-01-25 DIAGNOSIS — G44209 Tension-type headache, unspecified, not intractable: Secondary | ICD-10-CM | POA: Insufficient documentation

## 2020-01-25 MED ORDER — KETOROLAC TROMETHAMINE 60 MG/2ML IM SOLN
60.0000 mg | Freq: Once | INTRAMUSCULAR | Status: AC
  Administered 2020-01-25: 60 mg via INTRAMUSCULAR
  Filled 2020-01-25: qty 2

## 2020-01-25 MED ORDER — AMLODIPINE BESYLATE 5 MG PO TABS: 5.0000 mg | ORAL_TABLET | Freq: Every day | ORAL | 1 refills | Status: DC

## 2020-01-25 MED ORDER — AMLODIPINE BESYLATE 5 MG PO TABS: 5.0000 mg | ORAL_TABLET | Freq: Every day | ORAL | 1 refills | Status: AC

## 2020-01-25 MED ORDER — BUTALBITAL-APAP-CAFFEINE 50-325-40 MG PO TABS: 1.0000 | ORAL_TABLET | Freq: Four times a day (QID) | ORAL | 0 refills | Status: AC | PRN

## 2020-01-25 NOTE — Discharge Instructions (Addendum)
Your CT scan shows no acute findings.  Advised to restart blood pressure medication.  You have a 1 month supply to give a chance to establish care with your previous or new PCP.  Take Fioricet as needed for headache.

## 2020-01-25 NOTE — ED Provider Notes (Signed)
[facility] Emergency Department Provider Note   ____________________________________________   First MD Initiated Contact with Patient 01/25/20 1145     (approximate)  I have reviewed the triage vital signs and the nursing notes.   HISTORY  Chief Complaint Headache    HPI Sherry Richardson is a 44 y.o. female patient complain of right cervical headache for 4 days.  Patient states states been increased stressors in her life lately.  Stressors include daughter moving out going to college, new job position in a mandatory status, and husband just recently diagnosed with COVID-19.  Patient also states that she has been out of her blood pressure medications for approximately 6 months.  Patient denies vision change or vertigo.  Patient complaint of right ear pain but denies hearing loss.  Patient said the headache is moved from the occipital area to the right temporal area which prompted her visit to the emergency room today.         Past Medical History:  Diagnosis Date  . Hypertension     There are no problems to display for this patient.   Past Surgical History:  Procedure Laterality Date  . CESAREAN SECTION      Prior to Admission medications   Medication Sig Start Date End Date Taking? Authorizing Provider  amLODipine (NORVASC) 5 MG tablet Take 1 tablet (5 mg total) by mouth daily. 01/25/20 01/24/21  Joni Reining, PA-C  brompheniramine-pseudoephedrine-DM 30-2-10 MG/5ML syrup Take 10 mLs by mouth 4 (four) times daily as needed. 08/08/18   Chinita Pester, FNP  butalbital-acetaminophen-caffeine (FIORICET) 50-325-40 MG tablet Take 1 tablet by mouth every 6 (six) hours as needed for headache. 01/25/20   Joni Reining, PA-C  lisinopril (PRINIVIL,ZESTRIL) 20 MG tablet Take 1 tablet (20 mg total) by mouth daily. 10/20/16 10/20/17  Emily Filbert, MD    Allergies Patient has no known allergies.  History reviewed. No pertinent family  history.  Social History Social History   Tobacco Use  . Smoking status: Never Smoker  . Smokeless tobacco: Never Used  Vaping Use  . Vaping Use: Never used  Substance Use Topics  . Alcohol use: Yes  . Drug use: No    Review of Systems Constitutional: No fever/chills Eyes: No visual changes. ENT: No sore throat. Cardiovascular: Denies chest pain. Respiratory: Denies shortness of breath. Gastrointestinal: No abdominal pain.  No nausea, no vomiting.  No diarrhea.  No constipation. Genitourinary: Negative for dysuria. Musculoskeletal: Negative for back pain. Skin: Negative for rash. Neurological: Positive for headaches, but denies focal weakness or numbness. Endocrine:  Hypertension  ____________________________________________   PHYSICAL EXAM:  VITAL SIGNS: ED Triage Vitals  Enc Vitals Group     BP 01/25/20 1122 (!) 162/115     Pulse Rate 01/25/20 1122 80     Resp 01/25/20 1122 18     Temp 01/25/20 1122 98.9 F (37.2 C)     Temp Source 01/25/20 1122 Oral     SpO2 01/25/20 1122 100 %     Weight --      Height 01/25/20 1123 5\' 6"  (1.676 m)     Head Circumference --      Peak Flow --      Pain Score 01/25/20 1122 7     Pain Loc --      Pain Edu? --      Excl. in GC? --     Constitutional: Alert and oriented. Well appearing and in no acute distress. Eyes:  Conjunctivae are normal. PERRL. EOMI. Head: Atraumatic. Nose: No congestion/rhinnorhea. Mouth/Throat: Mucous membranes are moist.  Oropharynx non-erythematous. Neck:No cervical spine tenderness to palpation. Cardiovascular: Normal rate, regular rhythm. Grossly normal heart sounds.  Good peripheral circulation.  Elevated blood pressure Respiratory: Normal respiratory effort.  No retractions. Lungs CTAB. Neurologic:  Normal speech and language. No gross focal neurologic deficits are appreciated. No gait instability. Skin:  Skin is warm, dry and intact. No rash noted. Psychiatric: Mood and affect are normal.  Speech and behavior are normal.  ____________________________________________   LABS (all labs ordered are listed, but only abnormal results are displayed)  Labs Reviewed - No data to display ____________________________________________  EKG   ____________________________________________  RADIOLOGY  ED MD interpretation:    Official radiology report(s): CT Head Wo Contrast  Result Date: 01/25/2020 CLINICAL DATA:  Three-day history of right-sided headache EXAM: CT HEAD WITHOUT CONTRAST TECHNIQUE: Contiguous axial images were obtained from the base of the skull through the vertex without intravenous contrast. COMPARISON:  Oct 20, 2016 FINDINGS: Brain: The ventricles and sulci are normal in size and configuration. A focal area of calcification arising from the dura in the right frontal region is stable, measuring 1.3 x 0.7 cm, without appreciable surrounding edema. This focus of calcification may represent a small meningioma, stable. No other findings suggesting potential mass. There is no hemorrhage, extra-axial fluid collection, or midline shift. The brain parenchyma appears unremarkable. No evident acute infarct. Vascular: No hyperdense vessel. No appreciable vascular calcification. Skull: Bony calvarium appears intact. Sinuses/Orbits: Visualized paranasal sinuses are clear. Visualized orbits appear symmetric bilaterally. Other: Visualized mastoid air cells are clear. IMPRESSION: Calcification arising from the dura in the right frontal region is stable without mass effect or edema. This finding may represent a small calcified meningioma of no clinical significance. No other findings suggesting potential mass. No hemorrhage or extra-axial fluid collection. Brain parenchyma appears unremarkable. No evident acute infarct. Electronically Signed   By: Bretta Bang III M.D.   On: 01/25/2020 12:27    ____________________________________________   PROCEDURES  Procedure(s) performed  (including Critical Care):  Procedures   ____________________________________________   INITIAL IMPRESSION / ASSESSMENT AND PLAN / ED COURSE  As part of my medical decision making, I reviewed the following data within the electronic MEDICAL RECORD NUMBER     Patient presents with headache secondary to increased stress and noncompliance of blood pressure medication.  Discussed no acute findings on CT of the head.  Patient advised to restart blood pressure medication and establish care with PCP.  Patient given a work note and a prescription for Fioricet.    BRANDAN GLAUBER was evaluated in Emergency Department on 01/25/2020 for the symptoms described in the history of present illness. She was evaluated in the context of the global COVID-19 pandemic, which necessitated consideration that the patient might be at risk for infection with the SARS-CoV-2 virus that causes COVID-19. Institutional protocols and algorithms that pertain to the evaluation of patients at risk for COVID-19 are in a state of rapid change based on information released by regulatory bodies including the CDC and federal and state organizations. These policies and algorithms were followed during the patient's care in the ED.       ____________________________________________   FINAL CLINICAL IMPRESSION(S) / ED DIAGNOSES  Final diagnoses:  Tension headache  Essential hypertension     ED Discharge Orders         Ordered    amLODipine (NORVASC) 5 MG tablet  Daily,   Status:  Discontinued     Reprint     01/25/20 1303    butalbital-acetaminophen-caffeine (FIORICET) 50-325-40 MG tablet  Every 6 hours PRN     Discontinue  Reprint     01/25/20 1303    amLODipine (NORVASC) 5 MG tablet  Daily,   Status:  Discontinued     Reprint     01/25/20 1306    amLODipine (NORVASC) 5 MG tablet  Daily     Discontinue  Reprint     01/25/20 1306           Note:  This document was prepared using Dragon voice recognition software and may  include unintentional dictation errors.    Joni Reining, PA-C 01/25/20 1311    Willy Eddy, MD 01/25/20 1346

## 2020-01-25 NOTE — ED Triage Notes (Signed)
Pt here for headache that is right occipital. Pt has had lot of stress lately (daughter moved into college, took management position, her dad had covid, etc) and has been out of her bp meds as well.  NAD, appears well.  Ambulatory.  Has not taken anything today.

## 2020-01-25 NOTE — ED Notes (Addendum)
See triage note- Pt c/o right side headache present behind right eye that radiates to the back. Denies blurry vision or dizziness. Denies CP. Has also been out of blood pressure medication.

## 2020-02-16 ENCOUNTER — Other Ambulatory Visit: Payer: Self-pay | Admitting: Physician Assistant

## 2021-03-09 ENCOUNTER — Other Ambulatory Visit: Payer: Self-pay | Admitting: Physician Assistant

## 2021-03-09 DIAGNOSIS — Z1231 Encounter for screening mammogram for malignant neoplasm of breast: Secondary | ICD-10-CM

## 2021-06-24 ENCOUNTER — Ambulatory Visit
Admission: RE | Admit: 2021-06-24 | Discharge: 2021-06-24 | Disposition: A | Discharge status: Home / Self Care | Payer: Medicaid Other | Source: Ambulatory Visit | Attending: Emergency Medicine | Admitting: Emergency Medicine

## 2021-06-24 ENCOUNTER — Other Ambulatory Visit: Payer: Self-pay

## 2021-06-24 VITALS — BP 195/119 | HR 84 | Temp 99.5°F | Resp 18 | Ht 64.0 in | Wt 285.0 lb

## 2021-06-24 DIAGNOSIS — Z202 Contact with and (suspected) exposure to infections with a predominantly sexual mode of transmission: Secondary | ICD-10-CM | POA: Insufficient documentation

## 2021-06-24 MED ORDER — DOXYCYCLINE HYCLATE 100 MG PO CAPS: 100.0000 mg | ORAL_CAPSULE | Freq: Two times a day (BID) | ORAL | 0 refills | Status: AC

## 2021-06-24 NOTE — Discharge Instructions (Addendum)
We will send off a test they will notify you if anything comes back positive

## 2021-06-24 NOTE — ED Provider Notes (Signed)
[facility]    CSN: [id number] Arrival date & time: 06/24/21  0900      History   Chief Complaint Chief Complaint  Patient presents with   Exposure to STD    HPI Sherry Richardson is a 46 y.o. female.   Patient is here due to her partner called and he tested positive for chlamydia patient would like to be tested and treated.  Denies any symptoms.  Has never had any thing similar in the past.   Past Medical History:  Diagnosis Date   Hypertension     There are no problems to display for this patient.   Past Surgical History:  Procedure Laterality Date   CESAREAN SECTION      OB History   No obstetric history on file.      Home Medications    Prior to Admission medications   Medication Sig Start Date End Date Taking? Authorizing Provider  doxycycline (VIBRAMYCIN) 100 MG capsule Take 1 capsule (100 mg total) by mouth 2 (two) times daily. 06/24/21  Yes Maple Mirza L, NP  amLODipine (NORVASC) 5 MG tablet Take 1 tablet (5 mg total) by mouth daily. 01/25/20 01/24/21  Joni Reining, PA-C  brompheniramine-pseudoephedrine-DM 30-2-10 MG/5ML syrup Take 10 mLs by mouth 4 (four) times daily as needed. 08/08/18   Chinita Pester, FNP  butalbital-acetaminophen-caffeine (FIORICET) 50-325-40 MG tablet Take 1 tablet by mouth every 6 (six) hours as needed for headache. 01/25/20   Joni Reining, PA-C  lisinopril (PRINIVIL,ZESTRIL) 20 MG tablet Take 1 tablet (20 mg total) by mouth daily. 10/20/16 10/20/17  Emily Filbert, MD    Family History No family history on file.  Social History Social History   Tobacco Use   Smoking status: Never   Smokeless tobacco: Never  Vaping Use   Vaping Use: Never used  Substance Use Topics   Alcohol use: Yes   Drug use: No     Allergies   Patient has no known allergies.   Review of Systems Review of Systems  Constitutional: Negative.   Respiratory: Negative.    Cardiovascular: Negative.   Gastrointestinal:  Negative.   Genitourinary: Negative.   Neurological: Negative.     Physical Exam Triage Vital Signs ED Triage Vitals [06/24/21 0913]  Enc Vitals Group     BP (!) 195/119     Pulse Rate 84     Resp 18     Temp 99.5 F (37.5 C)     Temp Source Oral     SpO2 100 %     Weight 285 lb (129.3 kg)     Height 5\' 4"  (1.626 m)     Head Circumference      Peak Flow      Pain Score 0     Pain Loc      Pain Edu?      Excl. in GC?    No data found.  Updated Vital Signs BP (!) 195/119    Pulse 84    Temp 99.5 F (37.5 C) (Oral)    Resp 18    Ht 5\' 4"  (1.626 m)    Wt 285 lb (129.3 kg)    LMP 06/08/2021 (Approximate)    SpO2 100%    BMI 48.92 kg/m   Visual Acuity Right Eye Distance:   Left Eye Distance:   Bilateral Distance:    Right Eye Near:   Left Eye Near:    Bilateral Near:     Physical  Exam Constitutional:      Appearance: She is obese.  Cardiovascular:     Rate and Rhythm: Normal rate.  Pulmonary:     Effort: Pulmonary effort is normal.  Abdominal:     General: Abdomen is flat.  Musculoskeletal:     Cervical back: Normal range of motion.  Skin:    General: Skin is warm.  Neurological:     Mental Status: She is alert.     UC Treatments / Results  Labs (all labs ordered are listed, but only abnormal results are displayed) Labs Reviewed  CERVICOVAGINAL ANCILLARY ONLY    EKG   Radiology No results found.  Procedures Procedures (including critical care time)  Medications Ordered in UC Medications - No data to display  Initial Impression / Assessment and Plan / UC Course  I have reviewed the triage vital signs and the nursing notes.  Pertinent labs & imaging results that were available during my care of the patient were reviewed by me and considered in my medical decision making (see chart for details).     We will send off a test they will notify you if anything comes back positive Patient would like to be treated even though she has no  symptoms  Final Clinical Impressions(s) / UC Diagnoses   Final diagnoses:  Possible exposure to STD     Discharge Instructions      We will send off a test they will notify you if anything comes back positive    ED Prescriptions     Medication Sig Dispense Auth. Provider   doxycycline (VIBRAMYCIN) 100 MG capsule Take 1 capsule (100 mg total) by mouth 2 (two) times daily. 20 capsule Coralyn Mark, NP      PDMP not reviewed this encounter.   Coralyn Mark, NP 06/24/21 [phone]

## 2021-06-24 NOTE — ED Triage Notes (Signed)
Pt sts she was exposed to chlamydia a month ago and want to get tested.

## 2021-06-25 LAB — CERVICOVAGINAL ANCILLARY ONLY
Chlamydia: NEGATIVE
Comment: NEGATIVE
Comment: NORMAL
Neisseria Gonorrhea: NEGATIVE

## 2021-09-29 IMAGING — CT CT HEAD W/O CM
3 series · 15 of 46 positions shown, 18 images · non-contrast
Comparison: October 20, 2016

CLINICAL DATA: Three-day history of right-sided headache

EXAM:
CT HEAD WITHOUT CONTRAST
TECHNIQUE: Contiguous axial images were obtained from the base of the skull
through the vertex without intravenous contrast.

[Series 3: coronal soft tissue · coronal · 0.28mm/px · 3 of 65 slices shown]
[im 22/65  brain]
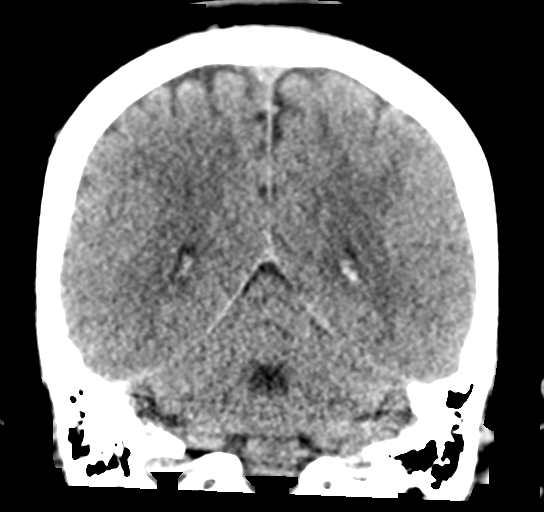
[im 29/65  brain]
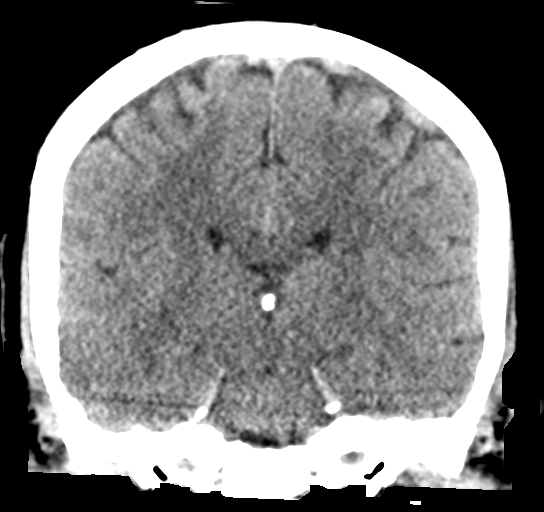
[im 36/65  brain]
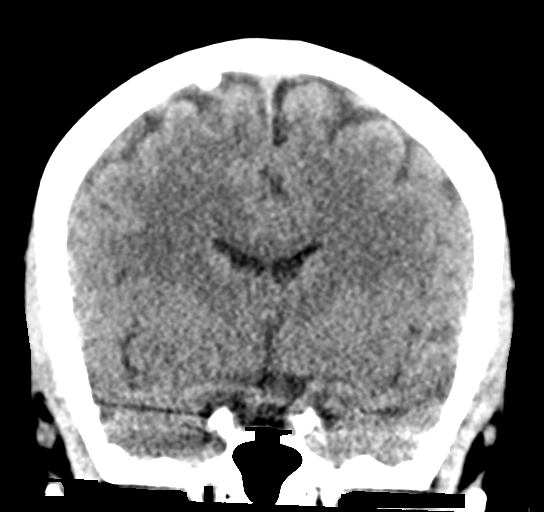

[Series 4: sagittal soft tissue · sagittal · 0.30mm/px · 3 of 54 slices shown]
[im 18/54  brain]
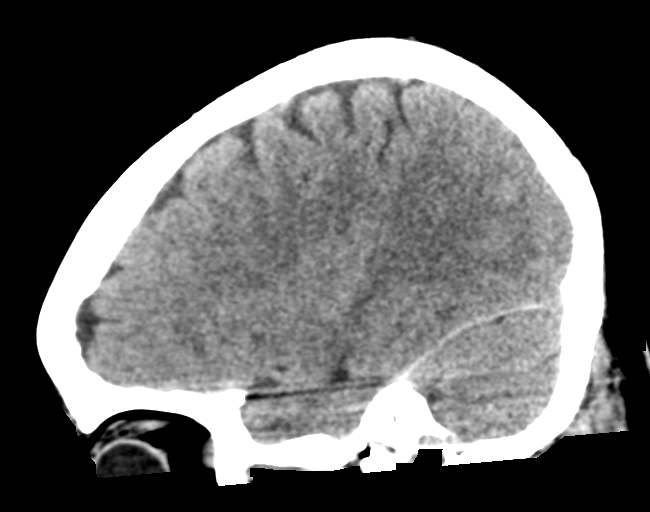
[im 27/54  brain]
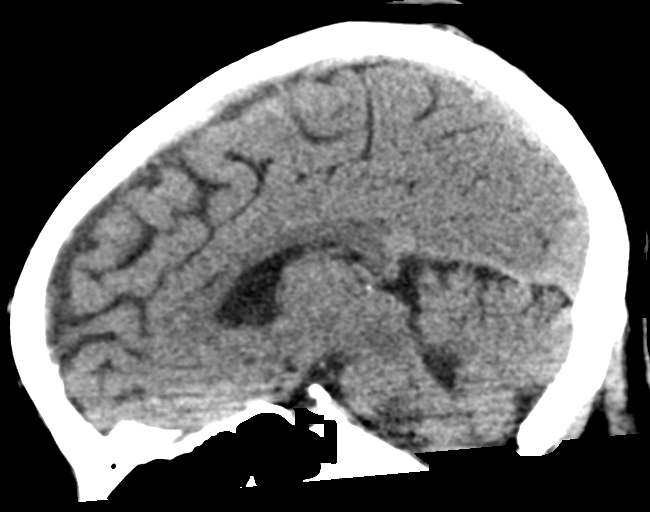
[im 36/54  brain]
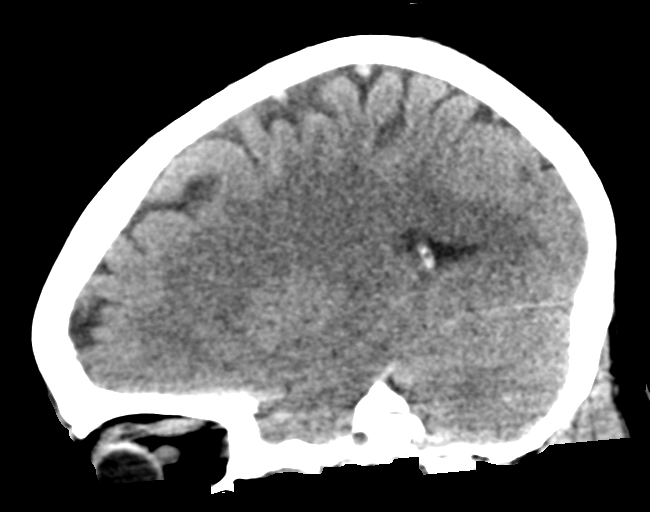

[Series 5: head wo · axial · 0.39mm/px · z∈[-96,+24]mm · 9 of 29 slices shown, 12 images]
[im 3/29  brain]
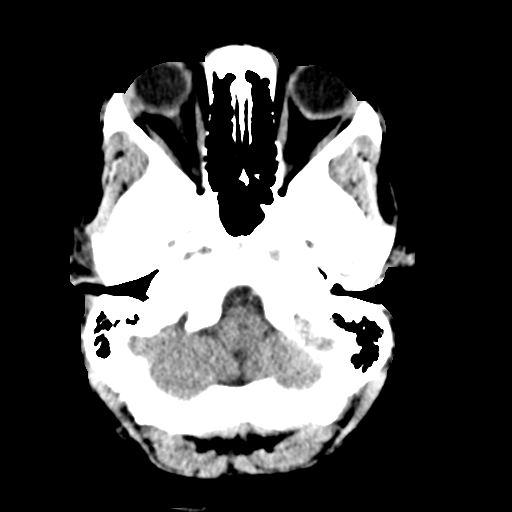
[im 3/29  bone]
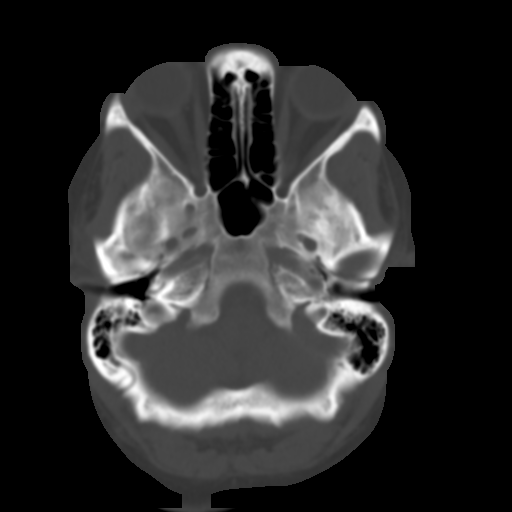
[im 6/29  brain]
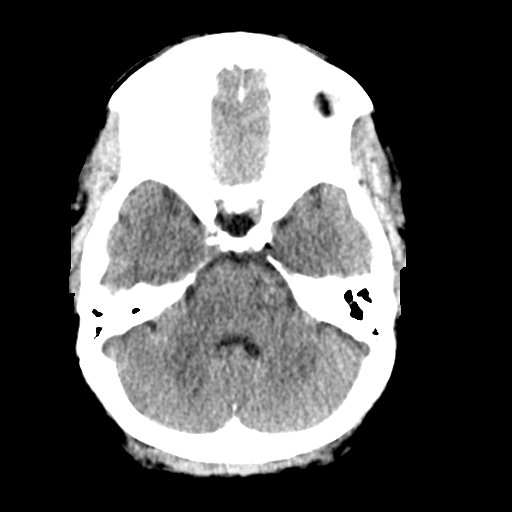
[im 9/29  brain]
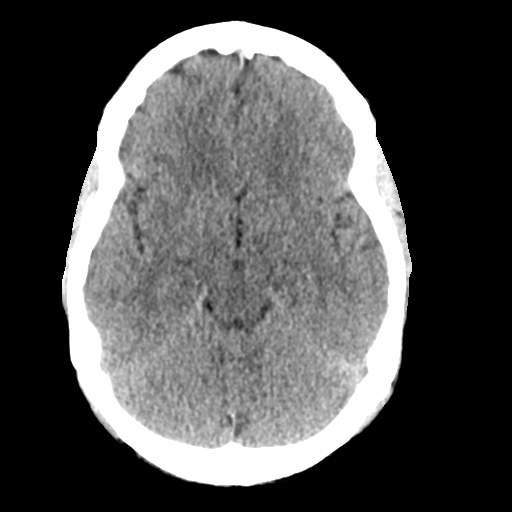
[im 12/29  brain]
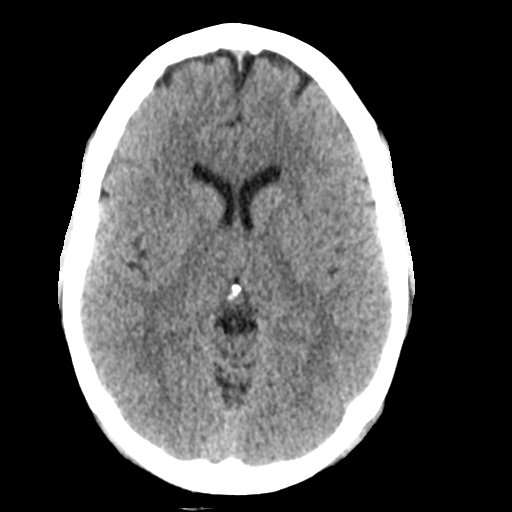
[im 15/29  brain]
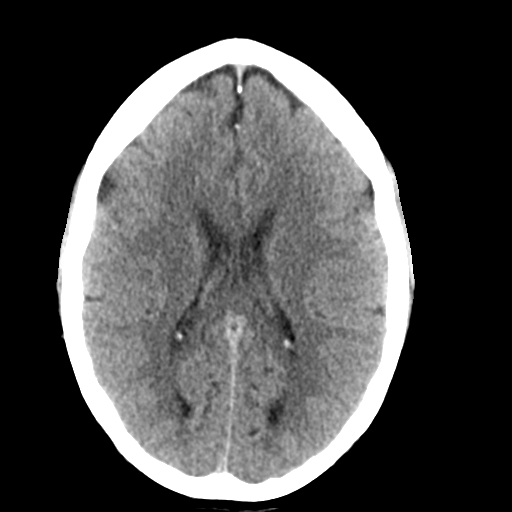
[im 15/29  bone]
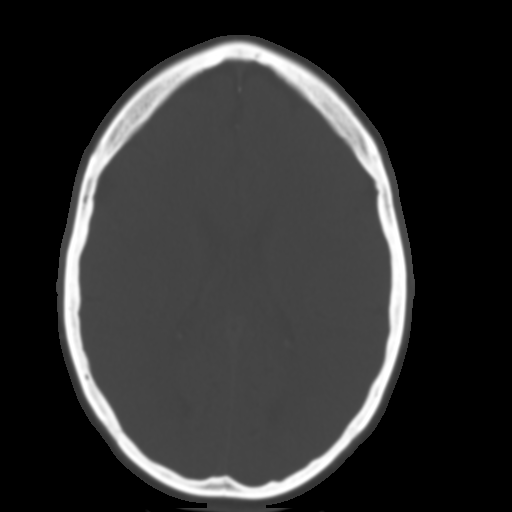
[im 18/29  brain]
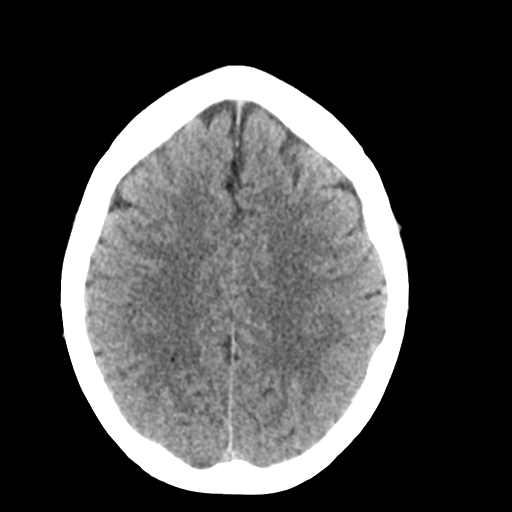
[im 21/29  brain]
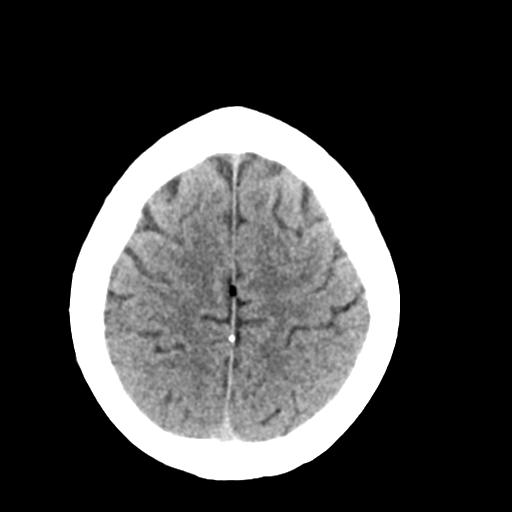
[im 24/29  brain]
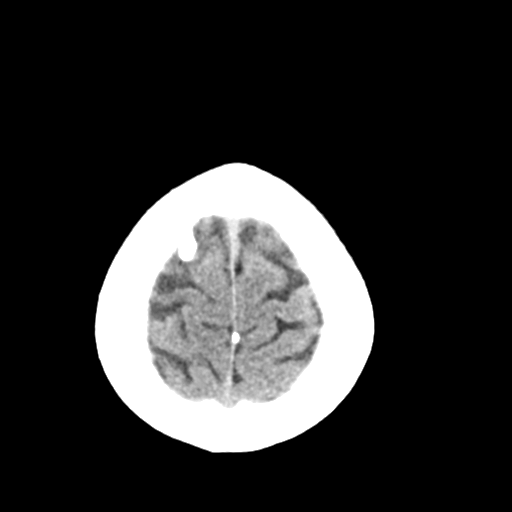
[im 27/29  brain]
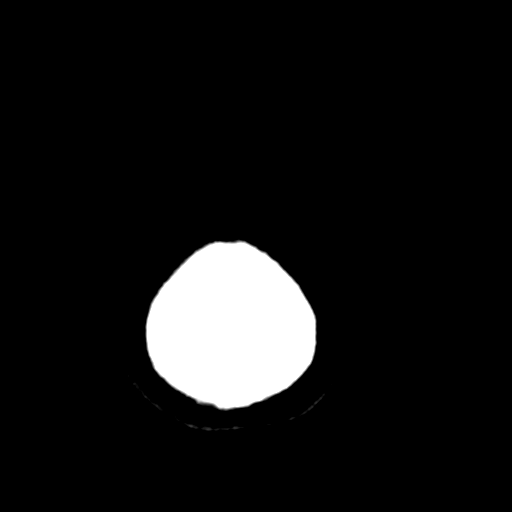
[im 27/29  bone]
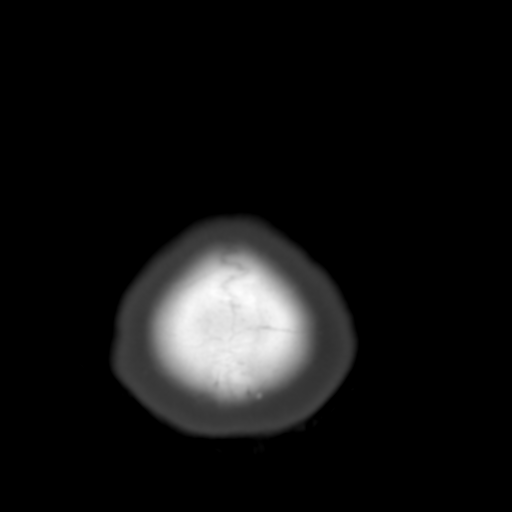

[15 of 46 positions shown; findings below may reference images not displayed]

FINDINGS: Brain: The ventricles and sulci are normal in size and
configuration. A focal area of calcification arising from the dura
in the right frontal region is stable, measuring 1.3 x 0.7 cm,
without appreciable surrounding edema. This focus of calcification
may represent a small meningioma, stable. No other findings
suggesting potential mass. There is no hemorrhage, extra-axial fluid
collection, or midline shift. The brain parenchyma appears
unremarkable. No evident acute infarct.

Vascular: No hyperdense vessel. No appreciable vascular
calcification.

Skull: Bony calvarium appears intact.

Sinuses/Orbits: Visualized paranasal sinuses are clear. Visualized
orbits appear symmetric bilaterally.

Other: Visualized mastoid air cells are clear.
IMPRESSION: Calcification arising from the dura in the right frontal region is
stable without mass effect or edema. This finding may represent a
small calcified meningioma of no clinical significance. No other
findings suggesting potential mass. No hemorrhage or extra-axial
fluid collection. Brain parenchyma appears unremarkable. No evident
acute infarct.
